# Patient Record
Sex: Female | Born: 1975 | Race: White | Hispanic: No | Marital: Married | Smoking: Former smoker
Health system: Southern US, Community
[De-identification: ages and names within clinical notes are randomized; demographics above are authoritative.]

## PROBLEM LIST (undated history)

## (undated) DIAGNOSIS — O24419 Gestational diabetes mellitus in pregnancy, unspecified control: Secondary | ICD-10-CM

## (undated) HISTORY — PX: WISDOM TOOTH EXTRACTION: SHX21

## (undated) HISTORY — PX: BUNIONECTOMY: SHX129

## (undated) HISTORY — DX: Gestational diabetes mellitus in pregnancy, unspecified control: O24.419

---

## 2001-07-05 DIAGNOSIS — O30049 Twin pregnancy, dichorionic/diamniotic, unspecified trimester: Secondary | ICD-10-CM

## 2001-07-05 DIAGNOSIS — Z452 Encounter for adjustment and management of vascular access device: Secondary | ICD-10-CM

## 2001-07-05 DIAGNOSIS — O210 Mild hyperemesis gravidarum: Secondary | ICD-10-CM

## 2001-07-05 DIAGNOSIS — O24419 Gestational diabetes mellitus in pregnancy, unspecified control: Secondary | ICD-10-CM

## 2004-04-18 ENCOUNTER — Inpatient Hospital Stay: Payer: Self-pay | Admitting: Unknown Physician Specialty

## 2005-03-23 ENCOUNTER — Inpatient Hospital Stay: Payer: Self-pay | Admitting: Psychiatry

## 2005-03-24 ENCOUNTER — Other Ambulatory Visit: Payer: Self-pay

## 2005-09-27 ENCOUNTER — Ambulatory Visit: Payer: Self-pay | Admitting: Internal Medicine

## 2009-02-14 ENCOUNTER — Emergency Department: Payer: Self-pay | Admitting: Emergency Medicine

## 2010-03-08 ENCOUNTER — Emergency Department: Payer: Self-pay | Admitting: Emergency Medicine

## 2010-10-01 ENCOUNTER — Emergency Department: Payer: Self-pay | Admitting: Emergency Medicine

## 2012-10-26 IMAGING — US US SOFT TISSUE EXCLUDE HEAD/NECK
1 series · 17 of 25 positions shown · non-contrast
Comparison: none

REASON FOR EXAM: hernia vs lymphadenitis
COMMENTS:

PROCEDURE:     US  - US SOFT TISSUE, NOT NECK /  HEAD  - October 02, 2010  [DATE]
RESULT:     Comparison: None.
TECHNIQUE: Multiple grayscale and color Doppler images were obtained of the
right groin.

[Series 1: us soft tissue exclude head/neck · 17 of 25 slices shown]
[im 1/25]
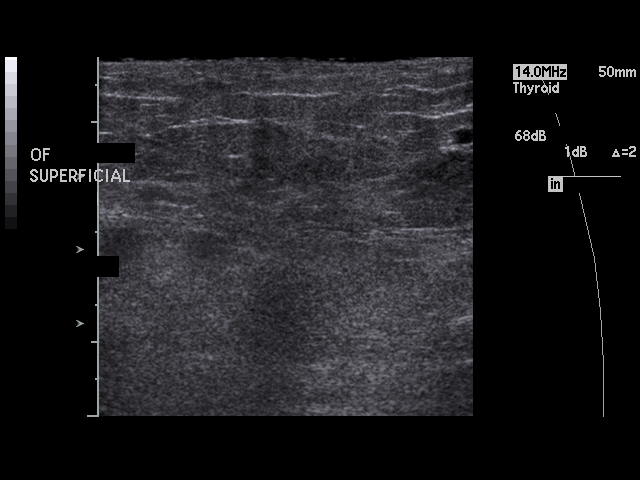
[im 3/25]
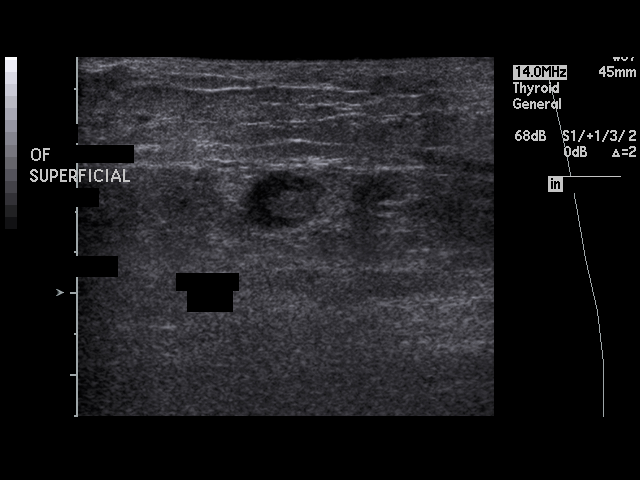
[im 4/25]
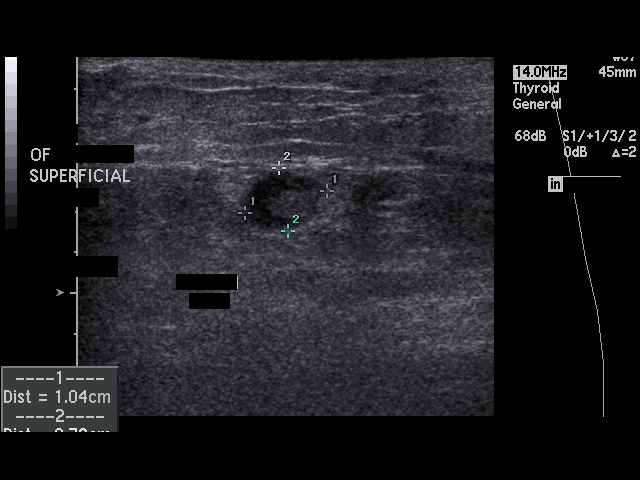
[im 6/25]
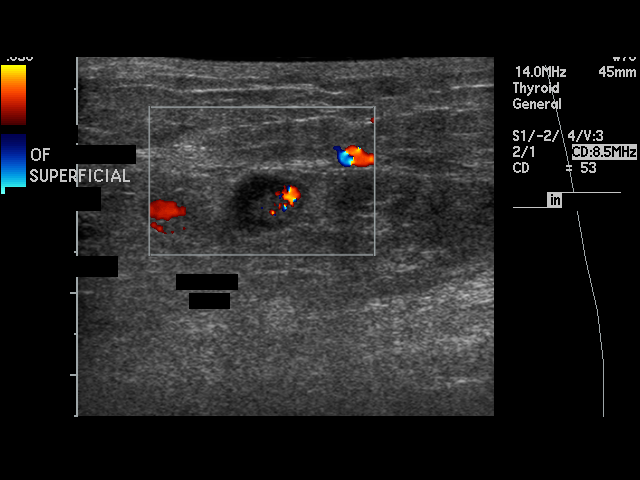
[im 7/25]
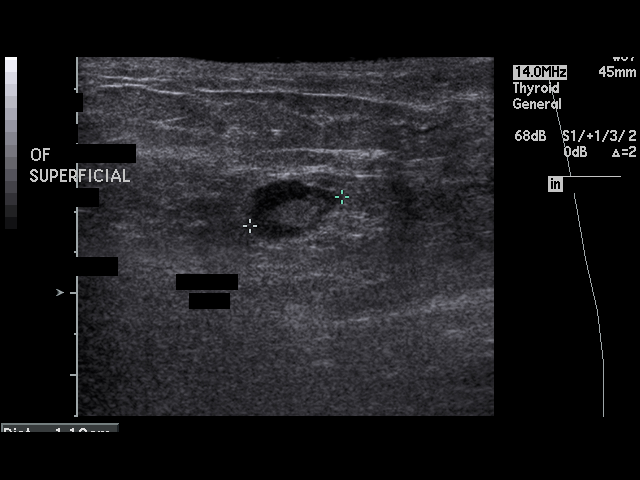
[im 9/25]
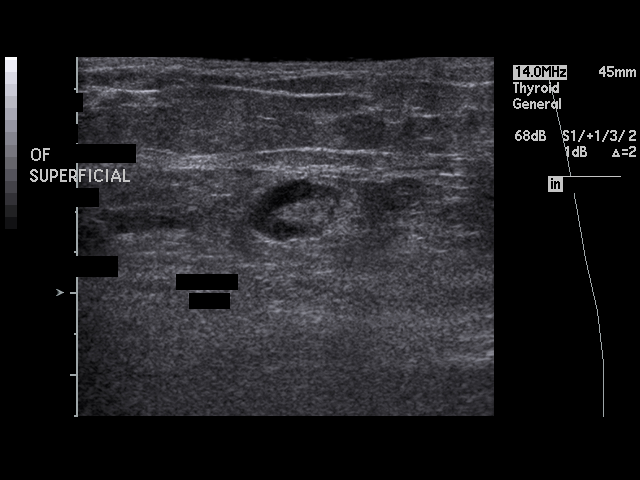
[im 10/25]
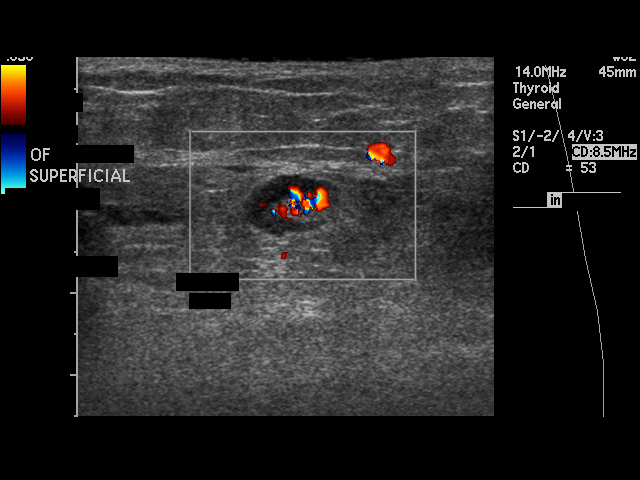
[im 12/25]
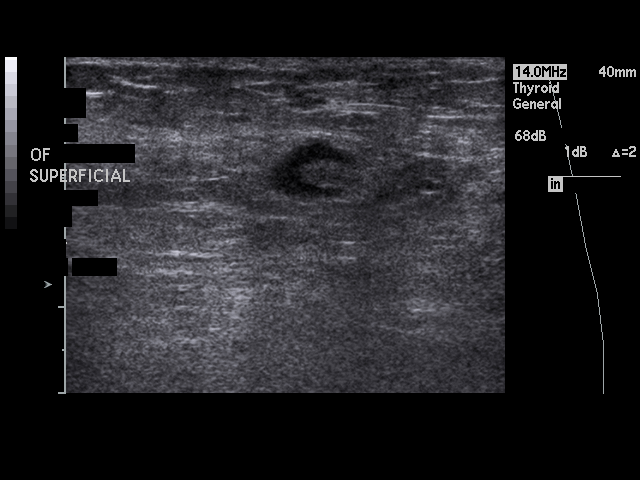
[im 13/25]
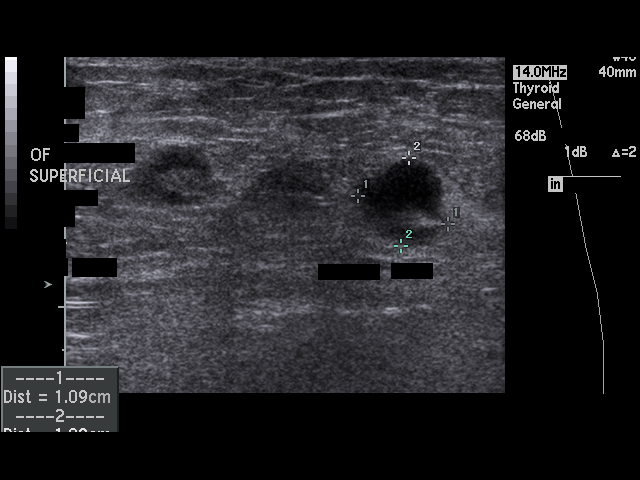
[im 14/25]
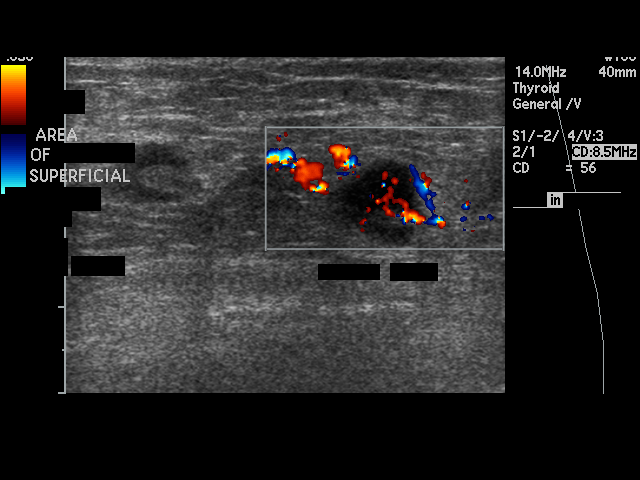
[im 16/25]
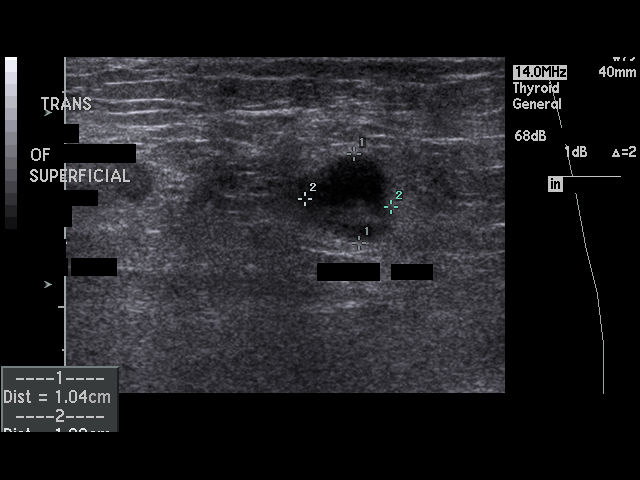
[im 17/25]
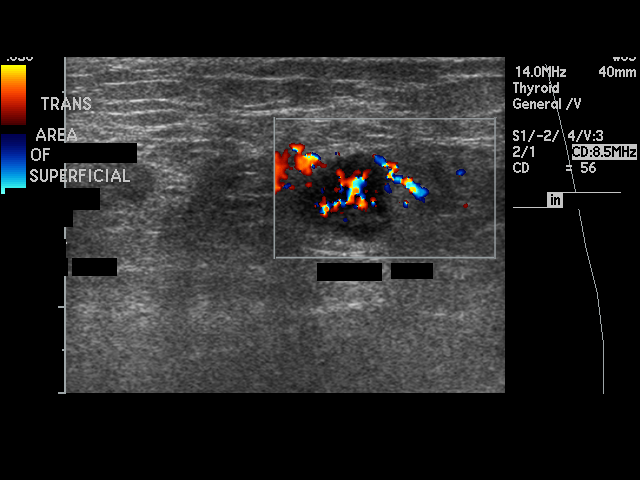
[im 19/25]
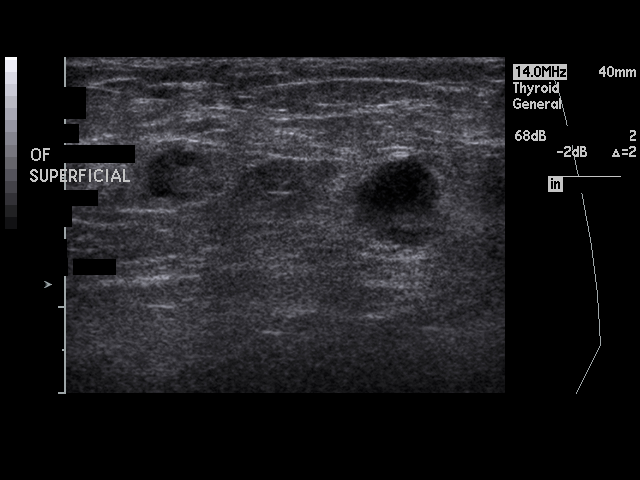
[im 20/25]
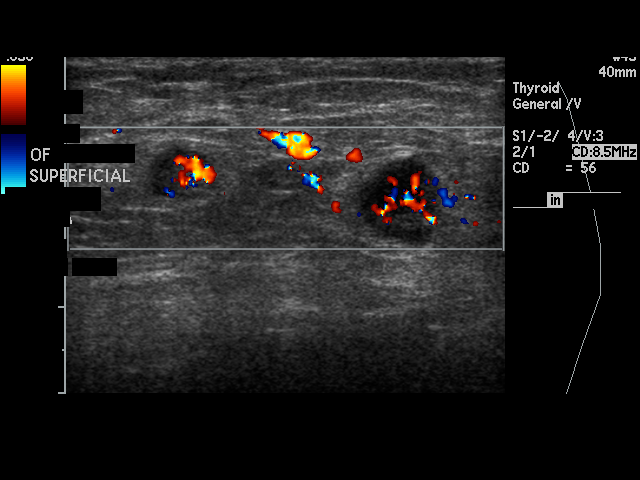
[im 22/25]
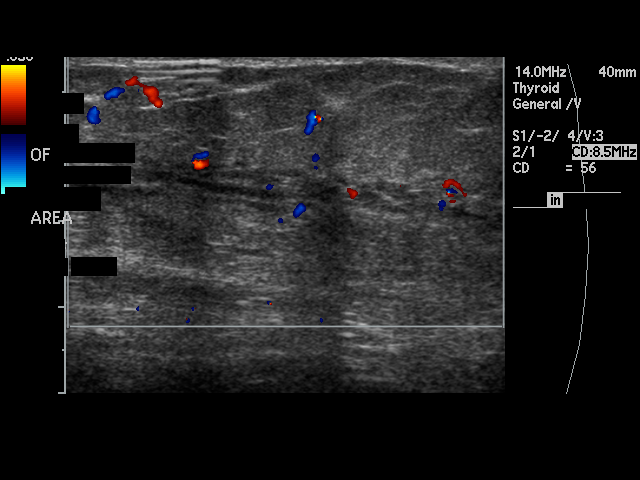
[im 23/25]
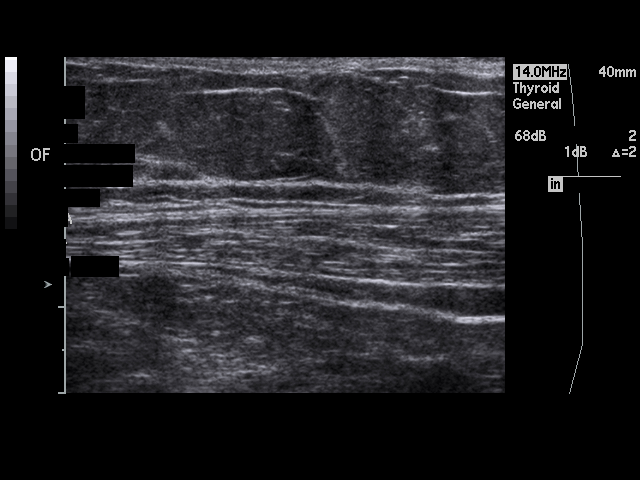
[im 25/25]
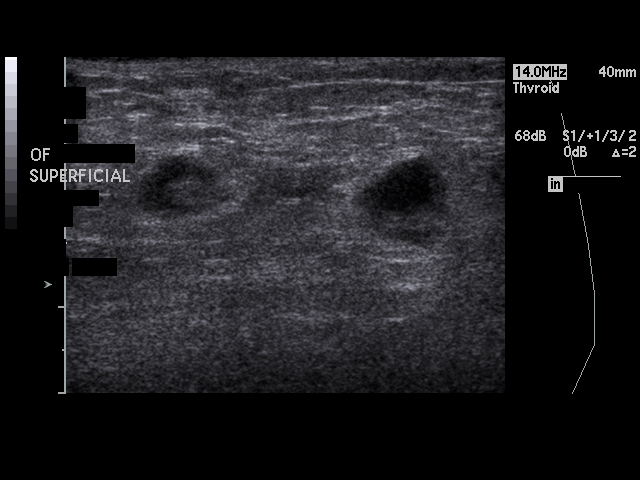

[17 of 25 positions shown; findings below may reference images not displayed]

FINDINGS: There are 2 hypoechoic masses with central hyper echogenicity which most
likely represent lymph nodes. One measures 1.2 x 1.0 x 0.8 cm.  The other
measures 1.1 x 1.0 x 1.0 cm. No definite hernia identified.
IMPRESSION: 1. Findings which represent 2 small lymph nodes in the region of
abnormality. These are nonspecific. Clinical followup is recommended to
ensure resolution.
2. No hernia seen. If there is continued clinical concern, consider CT for
further evaluation.

## 2017-11-25 ENCOUNTER — Ambulatory Visit (INDEPENDENT_AMBULATORY_CARE_PROVIDER_SITE_OTHER): Payer: BLUE CROSS/BLUE SHIELD | Admitting: Certified Nurse Midwife

## 2017-11-25 ENCOUNTER — Encounter: Payer: Self-pay | Admitting: Certified Nurse Midwife

## 2017-11-25 VITALS — BP 124/78 | HR 84 | Ht 65.0 in | Wt 172.0 lb

## 2017-11-25 DIAGNOSIS — O09299 Supervision of pregnancy with other poor reproductive or obstetric history, unspecified trimester: Secondary | ICD-10-CM

## 2017-11-25 DIAGNOSIS — O09521 Supervision of elderly multigravida, first trimester: Secondary | ICD-10-CM

## 2017-11-25 DIAGNOSIS — O09529 Supervision of elderly multigravida, unspecified trimester: Secondary | ICD-10-CM | POA: Insufficient documentation

## 2017-11-25 DIAGNOSIS — Z1151 Encounter for screening for human papillomavirus (HPV): Secondary | ICD-10-CM

## 2017-11-25 DIAGNOSIS — Z3481 Encounter for supervision of other normal pregnancy, first trimester: Secondary | ICD-10-CM

## 2017-11-25 DIAGNOSIS — Z3687 Encounter for antenatal screening for uncertain dates: Secondary | ICD-10-CM

## 2017-11-25 DIAGNOSIS — O09291 Supervision of pregnancy with other poor reproductive or obstetric history, first trimester: Secondary | ICD-10-CM

## 2017-11-25 DIAGNOSIS — Z124 Encounter for screening for malignant neoplasm of cervix: Secondary | ICD-10-CM

## 2017-11-25 DIAGNOSIS — Z113 Encounter for screening for infections with a predominantly sexual mode of transmission: Secondary | ICD-10-CM

## 2017-11-25 DIAGNOSIS — Z8632 Personal history of gestational diabetes: Secondary | ICD-10-CM

## 2017-11-25 DIAGNOSIS — Z3480 Encounter for supervision of other normal pregnancy, unspecified trimester: Secondary | ICD-10-CM

## 2017-11-25 LAB — HEMOGLOBIN A1C
HEMOGLOBIN A1C: 5.3 %{Hb} (ref <5.7)
MEAN PLASMA GLUCOSE: 105 (calc)
eAG (mmol/L): 5.8 (calc)

## 2017-11-25 NOTE — Progress Notes (Signed)
Subjective:  Phyllis Bennett is a 42 y.o. G3P2003 at [redacted]w[redacted]d being seen today for initial prenatal care.  She is currently monitored for the following issues for this low-risk pregnancy and has Advanced maternal age in multigravida; History of gestational diabetes in prior pregnancy, currently pregnant; and Supervision of other normal pregnancy, antepartum on their problem list.  Patient reports no complaints.   . Vag. Bleeding: None.   . Denies leaking of fluid.   The following portions of the patient's history were reviewed and updated as appropriate: allergies, current medications, past family history, past medical history, past social history, past surgical history and problem list. Problem list updated.  Objective:   Vitals:   11/25/17 1112 11/25/17 1113  BP: 124/78   Pulse: 84   Weight: 172 lb (78 kg)   Height:   (1.651 m)    Fetal Status: Fetal Heart Rate (bpm): 164         General:  Alert, oriented and cooperative. Patient is in no acute distress.  Skin: Skin is warm and dry. No rash noted.   Cardiovascular: Normal heart rate and rhythm noted  Respiratory: Normal respiratory effort, no problems with respiration noted, CTAB  Abdomen: Soft, gravid, appropriate for gestational age. Pain/Pressure: Absent     Pelvic: Vag. Bleeding: None Vag D/C Character: Thin   Cervical exam deferred        Extremities: Normal range of motion.  Edema: None  Mental Status: Normal mood and affect. Normal behavior. Normal judgment and thought content.      Breasts: nml     HEENT: nml  Urinalysis: Urine Protein: Negative Urine Glucose: Negative  Assessment and Plan:  Pregnancy: G3P2003 at [redacted]w[redacted]d  1. Encounter for supervision of other normal pregnancy in first trimester - Obstetric panel - HIV antibody (with reflex) - GC/Chlamydia probe amp ([place])not at [facility] - [place] bedside; Future - Culture, OB Urine - POCT bedside ultrasound - Cytology - PAP - HgB A1c - [facility] AMB BABYSCRIPTS OPT  IN  Pt is considering [facility], recommend transfer of records if she decides on care there  2. Multigravida of advanced maternal age in first trimester - declines all genetic screen - POCT bedside ultrasound  3. History of gestational diabetes in prior pregnancy, currently pregnant - recommend early GTT, pt agrees   Preterm labor symptoms and general obstetric precautions including but not limited to vaginal bleeding, contractions, leaking of fluid and fetal movement were reviewed in detail with the patient. Please refer to After Visit Summary for other counseling recommendations.  Return in about 1 month (around 12/23/2017).   Donette Larry, CNM

## 2017-11-25 NOTE — Progress Notes (Signed)
DATING AND VIABILITY SONOGRAM   Phyllis Bennett is a 42 y.o. year old G3P2 with LMP Patient's last menstrual period was 09/16/2017. which would correlate to  [redacted]w[redacted]d weeks gestation.  She has regular menstrual cycles.   She is here today for a confirmatory initial sonogram.    GESTATION: SINGLETON   FETAL ACTIVITY:          Heart rate         164          The fetus is active.  PLACENTA LOCALIZATION:   GRADE   CERVIX: Measures  cm  ADNEXA: The ovaries are normal. CL on LO   GESTATIONAL AGE AND  BIOMETRICS:  Gestational criteria: Estimated Date of Delivery: 06/23/18 by LMP now at [redacted]w[redacted]d  Previous Scans:0  GESTATIONAL SAC            mm          weeks  CROWN RUMP LENGTH           34.8 mm         10 2d weeks                                                                               AVERAGE EGA(BY THIS SCAN):  10 weeks 2days  WORKING EDD( LMP ):  06/23/18     TECHNICIAN COMMENTS:  Normal appearing fetus for GA.  Fetal mvt noted and cardiac activity   A copy of this report including all images has been saved and backed up to a second source for retrieval if needed. All measures and details of the anatomical scan, placentation, fluid volume and pelvic anatomy are contained in that report.  Granville Lewis 11/25/2017 12:13 PM

## 2017-11-28 LAB — URINE CULTURE, OB REFLEX

## 2017-11-28 LAB — CULTURE, OB URINE

## 2017-11-29 LAB — OBSTETRIC PANEL
Antibody Screen: NOT DETECTED
BASOS ABS: 27 {cells}/uL (ref 0–200)
Basophils Relative: 0.4 %
EOS PCT: 0.4 %
Eosinophils Absolute: 27 cells/uL (ref 15–500)
HEMATOCRIT: 43.2 % (ref 35.0–45.0)
HEMOGLOBIN: 14.8 g/dL (ref 11.7–15.5)
HEP B S AG: NONREACTIVE
LYMPHS ABS: 1319 {cells}/uL (ref 850–3900)
MCH: 29.8 pg (ref 27.0–33.0)
MCHC: 34.3 g/dL (ref 32.0–36.0)
MCV: 87.1 fL (ref 80.0–100.0)
MPV: 10.4 fL (ref 7.5–12.5)
Monocytes Relative: 5.4 %
NEUTROS ABS: 5059 {cells}/uL (ref 1500–7800)
Neutrophils Relative %: 74.4 %
Platelets: 251 10*3/uL (ref 140–400)
RBC: 4.96 10*6/uL (ref 3.80–5.10)
RDW: 13 % (ref 11.0–15.0)
RPR Ser Ql: NONREACTIVE
Rubella: 3.43 index
Total Lymphocyte: 19.4 %
WBC mixed population: 367 cells/uL (ref 200–950)
WBC: 6.8 10*3/uL (ref 3.8–10.8)

## 2017-11-29 LAB — GC/CHLAMYDIA PROBE AMP (~~LOC~~) NOT AT ARMC
CHLAMYDIA, DNA PROBE: NEGATIVE
NEISSERIA GONORRHEA: NEGATIVE

## 2017-11-29 LAB — HIV ANTIBODY (ROUTINE TESTING W REFLEX): HIV: NONREACTIVE

## 2017-11-30 LAB — CYTOLOGY - PAP
Diagnosis: NEGATIVE
HPV: NOT DETECTED

## 2017-12-27 ENCOUNTER — Encounter: Payer: BLUE CROSS/BLUE SHIELD | Admitting: Obstetrics & Gynecology

## 2018-09-15 ENCOUNTER — Encounter (HOSPITAL_COMMUNITY): Payer: Self-pay
